# Patient Record
Sex: Female | Born: 2005
Health system: Southern US, Community
[De-identification: ages and names within clinical notes are randomized; demographics above are authoritative.]

## PROBLEM LIST (undated history)

## (undated) DIAGNOSIS — R519 Headache, unspecified: Secondary | ICD-10-CM

## (undated) DIAGNOSIS — J189 Pneumonia, unspecified organism: Secondary | ICD-10-CM

---
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
## 2024-01-19 ENCOUNTER — Emergency Department (HOSPITAL_BASED_OUTPATIENT_CLINIC_OR_DEPARTMENT_OTHER): Payer: PRIVATE HEALTH INSURANCE

## 2024-01-19 ENCOUNTER — Other Ambulatory Visit: Payer: Self-pay

## 2024-01-19 ENCOUNTER — Emergency Department (HOSPITAL_BASED_OUTPATIENT_CLINIC_OR_DEPARTMENT_OTHER): Payer: PRIVATE HEALTH INSURANCE | Admitting: Radiology

## 2024-01-19 ENCOUNTER — Emergency Department (HOSPITAL_BASED_OUTPATIENT_CLINIC_OR_DEPARTMENT_OTHER)
Admission: EM | Admit: 2024-01-19 | Discharge: 2024-01-19 | Disposition: A | Discharge status: Home / Self Care | Payer: PRIVATE HEALTH INSURANCE | Attending: Emergency Medicine | Admitting: Emergency Medicine

## 2024-01-19 DIAGNOSIS — R519 Headache, unspecified: Secondary | ICD-10-CM | POA: Insufficient documentation

## 2024-01-19 DIAGNOSIS — M545 Low back pain, unspecified: Secondary | ICD-10-CM | POA: Diagnosis not present

## 2024-01-19 DIAGNOSIS — Y92410 Unspecified street and highway as the place of occurrence of the external cause: Secondary | ICD-10-CM | POA: Diagnosis not present

## 2024-01-19 LAB — PREGNANCY, URINE: Preg Test, Ur: NEGATIVE

## 2024-01-19 MED ORDER — IBUPROFEN 600 MG PO TABS
600.0000 mg | ORAL_TABLET | Freq: Four times a day (QID) | ORAL | 0 refills | Status: AC | PRN
Start: 2024-01-19 — End: ?

## 2024-01-19 MED ORDER — METHOCARBAMOL 500 MG PO TABS
500.0000 mg | ORAL_TABLET | Freq: Two times a day (BID) | ORAL | 0 refills | Status: AC
Start: 2024-01-19 — End: ?

## 2024-01-19 MED ORDER — METHOCARBAMOL 500 MG PO TABS
500.0000 mg | ORAL_TABLET | Freq: Once | ORAL | Status: AC
  Administered 2024-01-19: 500 mg via ORAL
  Filled 2024-01-19: qty 1

## 2024-01-19 MED ORDER — IBUPROFEN 400 MG PO TABS
600.0000 mg | ORAL_TABLET | Freq: Once | ORAL | Status: AC
  Administered 2024-01-19: 600 mg via ORAL
  Filled 2024-01-19: qty 1
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
## 2024-01-19 NOTE — Discharge Instructions (Signed)
The pain you are experiencing is likely due to muscle strain, you may take Ibuprofen and Robaxin as needed for pain management. Do not combine with any pain reliever other than tylenol.  You may also use ice and heat, and over-the-counter remedies such as Biofreeze gel or salon pas lidocaine patches. The muscle soreness should improve over the next week. Follow up with your family doctor in the next week for a recheck if you are still having symptoms. Return to ED if pain is worsening, you develop weakness or numbness of extremities, or new or concerning symptoms develop.  
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
